# Patient Record
Sex: Male | Born: 2000 | Race: White | Hispanic: No | Marital: Single | Smoking: Former smoker
Health system: Southern US, Community
[De-identification: ages and names within clinical notes are randomized; demographics above are authoritative.]

## PROBLEM LIST (undated history)

## (undated) DIAGNOSIS — J45909 Unspecified asthma, uncomplicated: Secondary | ICD-10-CM

---
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
## 2018-02-26 ENCOUNTER — Emergency Department: Payer: Self-pay

## 2018-02-26 ENCOUNTER — Encounter: Payer: Self-pay | Admitting: Emergency Medicine

## 2018-02-26 ENCOUNTER — Emergency Department
Admission: EM | Admit: 2018-02-26 | Discharge: 2018-02-26 | Disposition: A | Discharge status: Home / Self Care | Payer: Self-pay | Attending: Emergency Medicine | Admitting: Emergency Medicine

## 2018-02-26 ENCOUNTER — Other Ambulatory Visit: Payer: Self-pay

## 2018-02-26 DIAGNOSIS — T07XXXA Unspecified multiple injuries, initial encounter: Secondary | ICD-10-CM

## 2018-02-26 DIAGNOSIS — S00511A Abrasion of lip, initial encounter: Secondary | ICD-10-CM | POA: Insufficient documentation

## 2018-02-26 DIAGNOSIS — F10929 Alcohol use, unspecified with intoxication, unspecified: Secondary | ICD-10-CM | POA: Insufficient documentation

## 2018-02-26 DIAGNOSIS — Y939 Activity, unspecified: Secondary | ICD-10-CM | POA: Insufficient documentation

## 2018-02-26 DIAGNOSIS — Y929 Unspecified place or not applicable: Secondary | ICD-10-CM | POA: Insufficient documentation

## 2018-02-26 DIAGNOSIS — S60511A Abrasion of right hand, initial encounter: Secondary | ICD-10-CM | POA: Insufficient documentation

## 2018-02-26 DIAGNOSIS — Z87891 Personal history of nicotine dependence: Secondary | ICD-10-CM | POA: Insufficient documentation

## 2018-02-26 DIAGNOSIS — Y999 Unspecified external cause status: Secondary | ICD-10-CM | POA: Insufficient documentation

## 2018-02-26 DIAGNOSIS — S60512A Abrasion of left hand, initial encounter: Secondary | ICD-10-CM | POA: Insufficient documentation

## 2018-02-26 LAB — ETHANOL: Alcohol, Ethyl (B): 304 mg/dL (ref <10)
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
## 2018-02-26 NOTE — ED Notes (Signed)
Pts wounds cleaned at this time (face and hands )

## 2019-08-22 IMAGING — CT CT HEAD W/O CM
3 series · 15 of 46 positions shown, 18 images · non-contrast
Comparison: None.

CLINICAL DATA: Head trauma with ataxia

EXAM:
CT HEAD WITHOUT CONTRAST
TECHNIQUE: Contiguous axial images were obtained from the base of the skull
through the vertex without intravenous contrast.

[Series 2: head wo · axial · 0.41mm/px · z∈[-111,+9]mm · 9 of 29 slices shown, 12 images]
[im 3/29  brain]
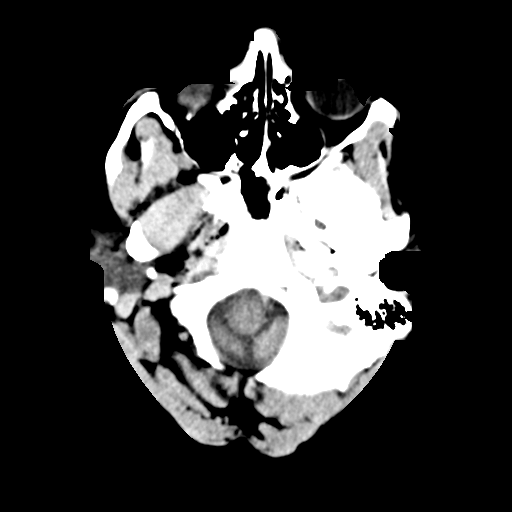
[im 3/29  bone]
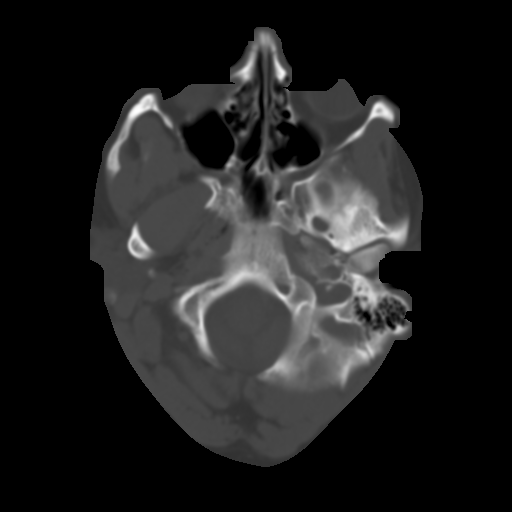
[im 6/29  brain]
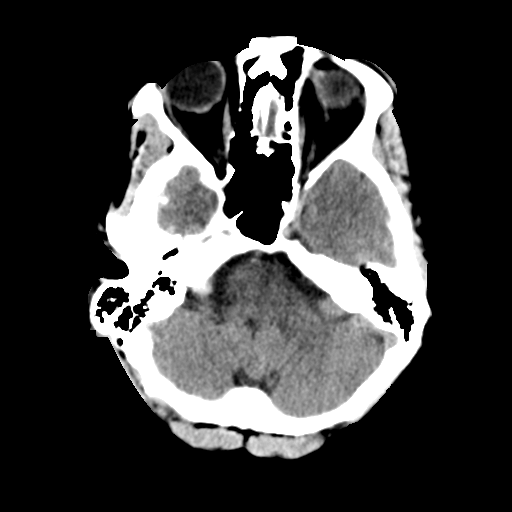
[im 9/29  brain]
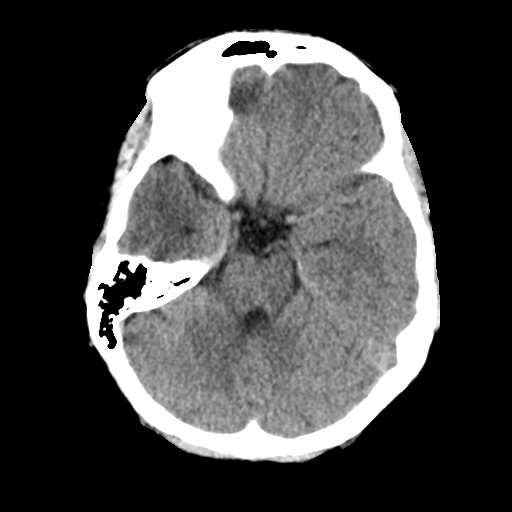
[im 12/29  brain]
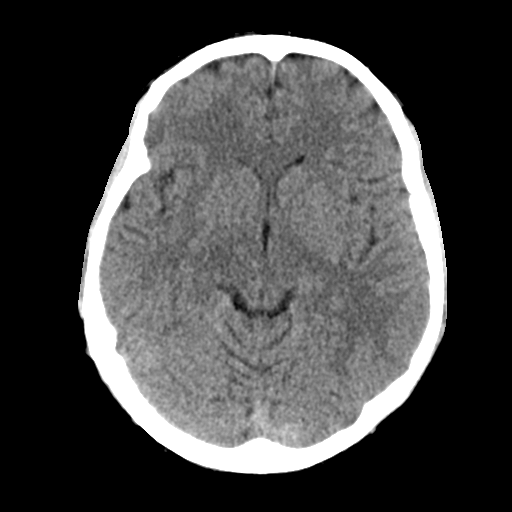
[im 15/29  brain]
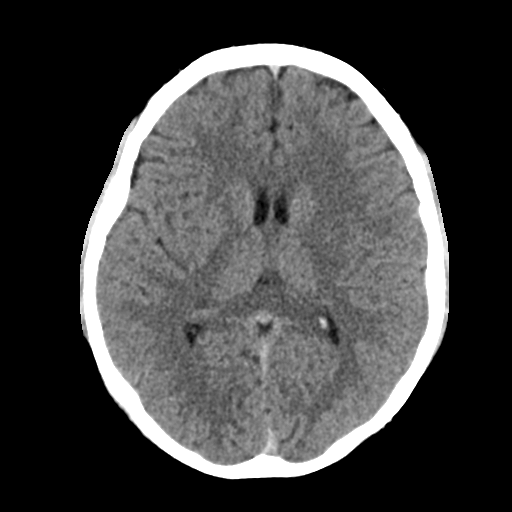
[im 15/29  bone]
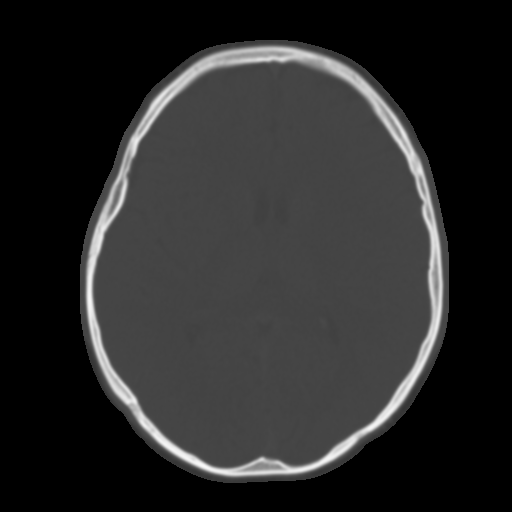
[im 18/29  brain]
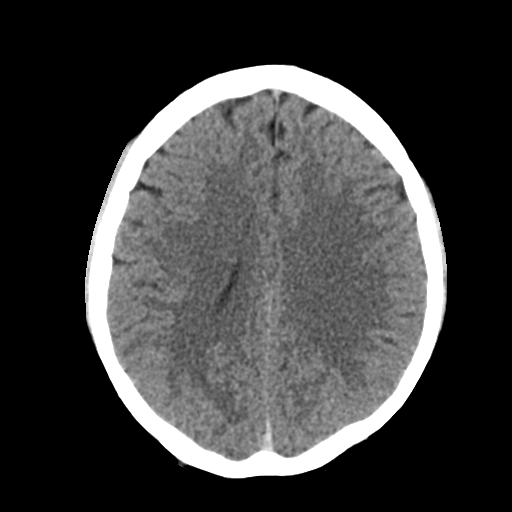
[im 21/29  brain]
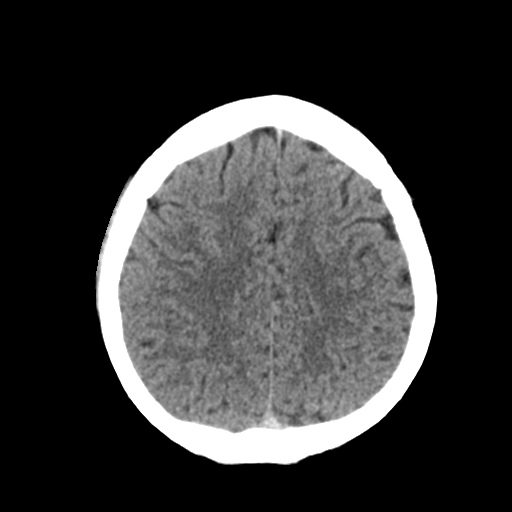
[im 24/29  brain]
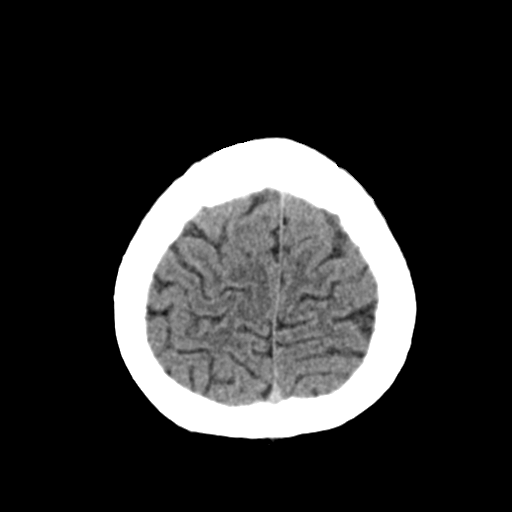
[im 27/29  brain]
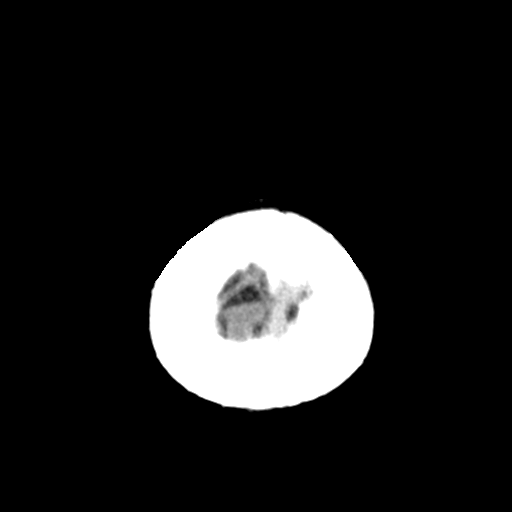
[im 27/29  bone]
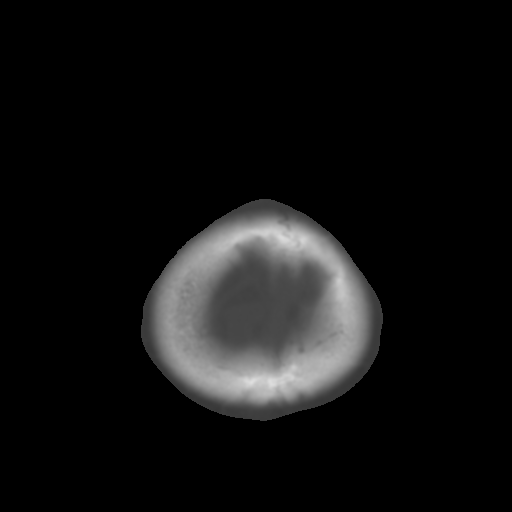

[Series 4: coronal soft tissue · coronal · 0.30mm/px · 3 of 65 slices shown]
[im 22/65  brain]
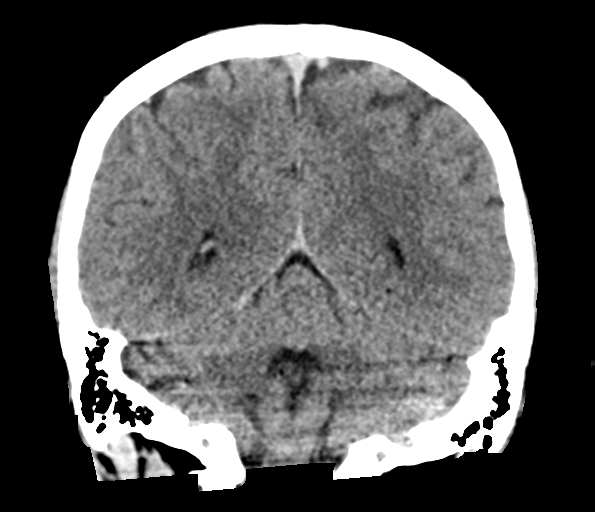
[im 29/65  brain]
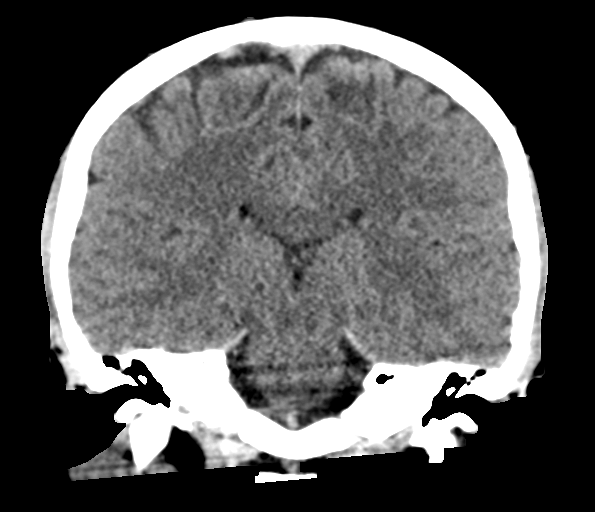
[im 36/65  brain]
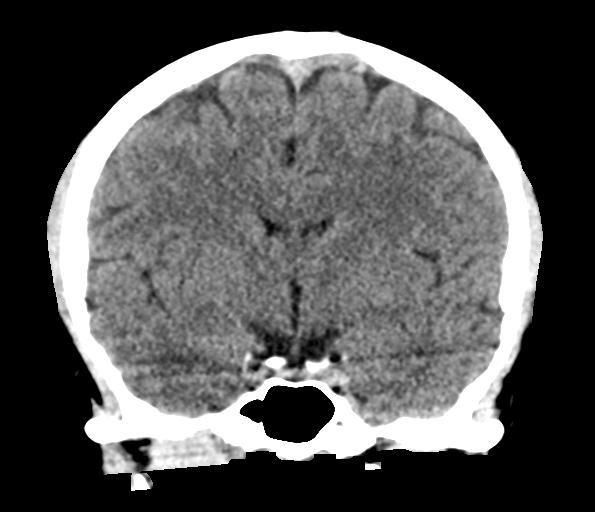

[Series 5: sagittal soft tissue · sagittal · 0.30mm/px · 3 of 61 slices shown]
[im 21/61  brain]
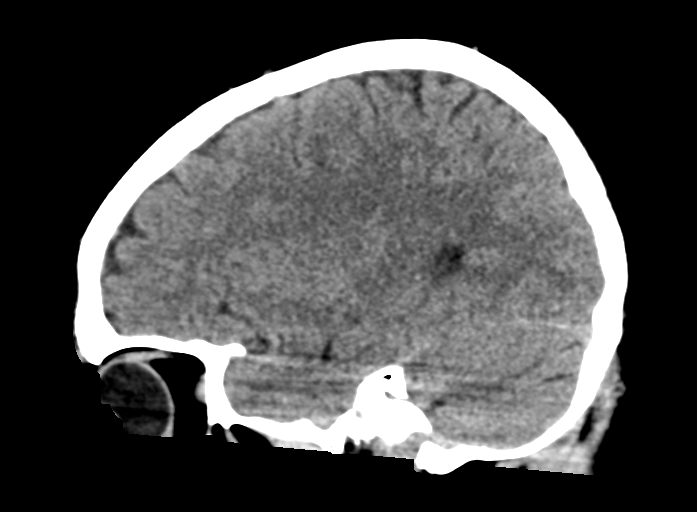
[im 31/61  brain]
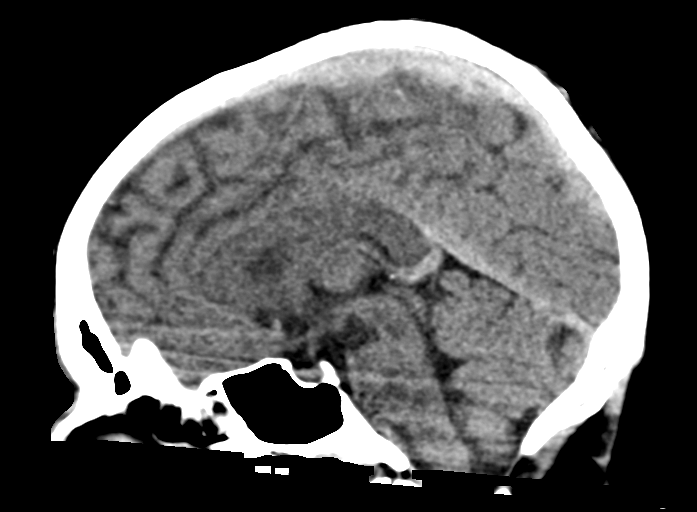
[im 41/61  brain]
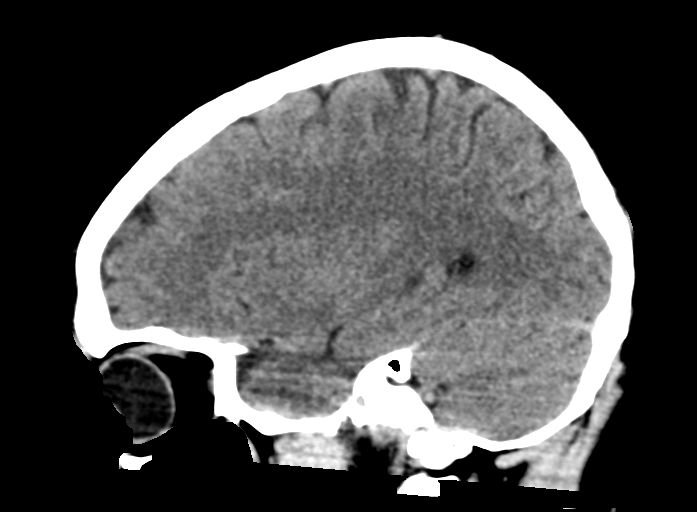

[15 of 46 positions shown; findings below may reference images not displayed]

FINDINGS: Brain: No evidence of acute infarction, hemorrhage, hydrocephalus,
extra-axial collection or mass lesion/mass effect.

Vascular: No hyperdense vessel or unexpected calcification.

Skull: Normal. Negative for fracture or focal lesion.

Sinuses/Orbits: No acute finding.

Other: None
IMPRESSION: Negative non contrasted CT appearance of the brain

## 2021-03-05 DIAGNOSIS — M542 Cervicalgia: Secondary | ICD-10-CM | POA: Insufficient documentation

## 2021-03-05 DIAGNOSIS — M545 Low back pain, unspecified: Secondary | ICD-10-CM | POA: Insufficient documentation

## 2021-07-22 DIAGNOSIS — Z029 Encounter for administrative examinations, unspecified: Secondary | ICD-10-CM | POA: Insufficient documentation

## 2021-11-30 ENCOUNTER — Other Ambulatory Visit

## 2021-11-30 ENCOUNTER — Ambulatory Visit (INDEPENDENT_AMBULATORY_CARE_PROVIDER_SITE_OTHER)

## 2021-11-30 ENCOUNTER — Ambulatory Visit
Admission: EM | Admit: 2021-11-30 | Discharge: 2021-11-30 | Disposition: A | Discharge status: Home / Self Care | Attending: Urgent Care | Admitting: Urgent Care

## 2021-11-30 DIAGNOSIS — F1010 Alcohol abuse, uncomplicated: Secondary | ICD-10-CM | POA: Insufficient documentation

## 2021-11-30 DIAGNOSIS — M795 Residual foreign body in soft tissue: Secondary | ICD-10-CM
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
## 2021-11-30 NOTE — Discharge Instructions (Addendum)
Follow up here or with your primary care provider if signs of infection develop.  Return for suture removal in 1 week.

## 2021-11-30 NOTE — ED Provider Notes (Signed)
[facility]    CSN: [id number] Arrival date & time: 11/30/21  1431      History   Chief Complaint No chief complaint on file.   HPI Juan Lindsey is a 21 y.o. male.   HPI  Patient presents to UC with report of foreign body in his left middle finger at the dorsal PIP.  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  Patient believes his body is glass which entered his skin while on active duty in the [place] in 2020.  He says the nodule was bleeding yesterday.  No past medical history on file.  There are no problems to display for this patient.   No past surgical history on file.     Home Medications    Prior to Admission medications   Not on File    Family History No family history on file.  Social History Social History   Tobacco Use   Smoking status: Former    Types: Cigarettes   Smokeless tobacco: Former    Types: Chew  Substance Use Topics   Alcohol use: Yes   Drug use: Never     Allergies   Patient has no known allergies.   Review of Systems Review of Systems   Physical Exam Triage Vital Signs ED Triage Vitals  Enc Vitals Group     BP      Pulse      Resp      Temp      Temp src      SpO2      Weight      Height      Head Circumference      Peak Flow      Pain Score      Pain Loc      Pain Edu?      Excl. in [place]?    No data found.  Updated Vital Signs There were no vitals taken for this visit.  Visual Acuity Right Eye Distance:   Left Eye Distance:   Bilateral Distance:    Right Eye Near:   Left Eye Near:    Bilateral Near:     Physical Exam Vitals reviewed.  Constitutional:      Appearance: Normal appearance.  Musculoskeletal:       Hands:  Skin:    General: Skin is warm and dry.  Neurological:     General: No focal deficit present.     Mental Status: He is alert and oriented to person, place, and time.  Psychiatric:        Mood and Affect:  Mood normal.        Behavior: Behavior normal.      UC Treatments / Results  Labs (all labs ordered are listed, but only abnormal results are displayed) Labs Reviewed - No data to display  EKG   Radiology No results found.  Procedures Foreign Body Removal  Date/Time: 11/30/2021 3:31 PM  Performed by: Rose Phi, FNP Authorized by: Rose Phi, FNP   Consent:    Consent obtained:  Verbal   Consent given by:  Patient   Risks, benefits, and alternatives were discussed: yes     Risks discussed:  Bleeding, infection and pain Universal protocol:    Procedure explained and questions answered to patient or proxy's satisfaction: yes     Relevant documents present and verified: yes     Test results available: no  Imaging studies available: no     Required blood products, implants, devices, and special equipment available: no     Site/side marked: no     Immediately prior to procedure, a time out was called: yes     Patient identity confirmed:  Verbally with patient Location:    Location:  Finger   Finger location:  L middle finger   Depth:  Intradermal   Tendon involvement:  None Pre-procedure details:    Imaging:  X-ray   Neurovascular status: intact     Preparation: Patient was prepped and draped in usual sterile fashion   Anesthesia:    Anesthesia method:  Topical application and local infiltration   Topical anesthetic:  Benzocaine gel   Local anesthetic:  Lidocaine 1% WITH epi Procedure type:    Procedure complexity:  Simple Procedure details:    Incision length:  2 mm   Localization method:  Probed   Dissection of underlying tissues: no     Bloodless field: no     Removal mechanism:  Hemostat   Foreign bodies recovered:  1   Description:  Shard of glass, triangular shaped, approx 3 mm in length   Intact foreign body removal: yes   Post-procedure details:    Neurovascular status: intact     Confirmation:  No additional foreign bodies on  visualization   Skin closure:  Sutures   Suture size:  4-0   Suture material:  Prolene   Suture technique:  Simple interrupted   Dressing:  Antibiotic ointment and adhesive bandage   Procedure completion:  Tolerated Comments:     A single 4-0 Prolene suture was placed at the incision site to provide reinforcement given the location of the suture is a bending joint.  Applied antibiotic ointment and a Band-Aid to protect the incision site while the patient works.  Patient is likely to attempt suture removal at home.  Asked him to watch for signs and symptoms of infection.  (including critical care time)  Medications Ordered in UC Medications - No data to display  Initial Impression / Assessment and Plan / UC Course  I have reviewed the triage vital signs and the nursing notes.  Pertinent labs & imaging results that were available during my care of the patient were reviewed by me and considered in my medical decision making (see chart for details).   See procedure note   Final Clinical Impressions(s) / UC Diagnoses   Final diagnoses:  Foreign body (FB) in soft tissue   Discharge Instructions   None    ED Prescriptions   None    PDMP not reviewed this encounter.   Charma Igo, [place] 11/30/21 1534

## 2022-01-03 ENCOUNTER — Ambulatory Visit
Admission: EM | Admit: 2022-01-03 | Discharge: 2022-01-03 | Disposition: A | Discharge status: Home / Self Care | Attending: Emergency Medicine | Admitting: Emergency Medicine

## 2022-01-03 DIAGNOSIS — Z1152 Encounter for screening for COVID-19: Secondary | ICD-10-CM | POA: Insufficient documentation

## 2022-01-03 DIAGNOSIS — B349 Viral infection, unspecified: Secondary | ICD-10-CM | POA: Diagnosis not present

## 2022-01-03 LAB — RESP PANEL BY RT-PCR (FLU A&B, COVID) ARPGX2
Influenza A by PCR: NEGATIVE
Influenza B by PCR: NEGATIVE
SARS Coronavirus 2 by RT PCR: NEGATIVE

## 2022-01-03 LAB — POCT RAPID STREP A (OFFICE): Rapid Strep A Screen: NEGATIVE

## 2022-01-03 NOTE — ED Triage Notes (Signed)
Patient to Urgent Care with complaints of sore throat and fatigue and body aches. Reports symptoms started this morning. Denies any known fevers. Dry cough.

## 2022-01-03 NOTE — Discharge Instructions (Signed)
Your strep test is negative.  Your COVID and Flu tests are pending.    Take Tylenol or ibuprofen as needed for fever or discomfort.  Rest and keep yourself hydrated.    Follow-up with your primary care provider if your symptoms are not improving.     

## 2022-01-03 NOTE — ED Provider Notes (Signed)
[facility]    CSN: [id number] Arrival date & time: 01/03/22  1611      History   Chief Complaint Chief Complaint  Patient presents with   Sore Throat   Fatigue    HPI Juan Lindsey is a 21 y.o. male.  Patient presents with body aches, fatigue, sore throat, cough since this morning.  No fever, rash, shortness of breath, vomiting, diarrhea, or other symptoms.  No treatments at home.  The history is provided by the patient and medical records.    History reviewed. No pertinent past medical history.  Patient Active Problem List   Diagnosis Date Noted   Alcohol abuse, uncomplicated 11/30/2021   Encounter for administrative examinations, unspecified 07/22/2021   Low back pain, unspecified 03/05/2021   Neck pain 03/05/2021    History reviewed. No pertinent surgical history.     Home Medications    Prior to Admission medications   Medication Sig Start Date End Date Taking? Authorizing Provider  cephALEXin (KEFLEX) 500 MG capsule TAKE ONE CAPSULE BY MOUTH 4 TIMES A DAY FOR 10 DAYS    [provider]  fluticasone (FLONASE) 50 MCG/ACT nasal spray Place into the nose. 01/28/21 01/28/22  [provider]  gabapentin (NEURONTIN) 300 MG capsule Take by mouth. 12/24/20   [provider]  guaiFENesin (MUCINEX) 600 MG 12 hr tablet Take by mouth. 01/28/21 01/28/22  [provider]  ibuprofen (ADVIL) 600 MG tablet Take with food/milk.Take or use exactly as directed.Obtain advice for OTCs.May cause drowsiness/dizziness.Do not take if pregnant. 03/05/21 03/05/22  [provider]  meloxicam (MOBIC) 15 MG tablet Take 1 tablet by mouth daily.    [provider]  tizanidine (ZANAFLEX) 2 MG capsule tiZANidine 2 mg oral capsule Start Date: 12/30/19 Status: Ordered 12/30/19   [provider]    Family History History reviewed. No pertinent family history.  Social History Social History   Tobacco Use   Smoking  status: Former    Types: Cigarettes   Smokeless tobacco: Former    Types: Chew  Substance Use Topics   Alcohol use: Yes   Drug use: Never     Allergies   Patient has no known allergies.   Review of Systems Review of Systems  Constitutional:  Positive for fatigue. Negative for chills and fever.  HENT:  Positive for sore throat. Negative for ear pain.   Respiratory:  Positive for cough. Negative for shortness of breath.   Cardiovascular:  Negative for chest pain and palpitations.  Gastrointestinal:  Negative for diarrhea and vomiting.  Skin:  Negative for rash.  All other systems reviewed and are negative.    Physical Exam Triage Vital Signs ED Triage Vitals  Enc Vitals Group     BP      Pulse      Resp      Temp      Temp src      SpO2      Weight      Height      Head Circumference      Peak Flow      Pain Score      Pain Loc      Pain Edu?      Excl. in [place]?    No data found.  Updated Vital Signs BP 112/82   Pulse 62   Temp 98.2 F (36.8 C)   Resp 18   Ht 5\' 8"  (1.727 m)   Wt 190 lb (86.2 kg)  SpO2 98%   BMI 28.89 kg/m   Visual Acuity Right Eye Distance:   Left Eye Distance:   Bilateral Distance:    Right Eye Near:   Left Eye Near:    Bilateral Near:     Physical Exam Vitals and nursing note reviewed.  Constitutional:      General: He is not in acute distress.    Appearance: Normal appearance. He is well-developed. He is not ill-appearing.  HENT:     Right Ear: Tympanic membrane normal.     Left Ear: Tympanic membrane normal.     Nose: Nose normal.     Mouth/Throat:     Mouth: Mucous membranes are moist.     Pharynx: Oropharynx is clear.  Cardiovascular:     Rate and Rhythm: Normal rate and regular rhythm.     Heart sounds: Normal heart sounds.  Pulmonary:     Effort: Pulmonary effort is normal. No respiratory distress.     Breath sounds: Normal breath sounds.  Musculoskeletal:     Cervical back: Neck supple.  Skin:    General:  Skin is warm and dry.  Neurological:     Mental Status: He is alert.  Psychiatric:        Mood and Affect: Mood normal.        Behavior: Behavior normal.      UC Treatments / Results  Labs (all labs ordered are listed, but only abnormal results are displayed) Labs Reviewed  RESP PANEL BY RT-PCR (FLU A&B, COVID) [id number]  POCT RAPID STREP A (OFFICE)    EKG   Radiology No results found.  Procedures Procedures (including critical care time)  Medications Ordered in UC Medications - No data to display  Initial Impression / Assessment and Plan / UC Course  I have reviewed the triage vital signs and the nursing notes.  Pertinent labs & imaging results that were available during my care of the patient were reviewed by me and considered in my medical decision making (see chart for details).    Viral illness.  Rapid strep negative.  COVID and Flu pending.  Discussed symptomatic treatment including Tylenol or ibuprofen, rest, hydration.  Instructed patient to follow up with PCP if symptoms are not improving.  He agrees to plan of care.   Final Clinical Impressions(s) / UC Diagnoses   Final diagnoses:  Viral illness     Discharge Instructions      Your strep test is negative.  Your COVID and Flu tests are pending.   Take Tylenol or ibuprofen as needed for fever or discomfort.  Rest and keep yourself hydrated.    Follow-up with your primary care provider if your symptoms are not improving.         ED Prescriptions   None    PDMP not reviewed this encounter.   Mickie Bail, NP 01/03/22 [phone]

## 2022-05-04 ENCOUNTER — Emergency Department: Payer: Self-pay

## 2022-05-04 ENCOUNTER — Other Ambulatory Visit: Payer: Self-pay

## 2022-05-04 ENCOUNTER — Emergency Department
Admission: EM | Admit: 2022-05-04 | Discharge: 2022-05-04 | Discharge status: Against Medical Advice | Payer: Self-pay | Attending: Emergency Medicine | Admitting: Emergency Medicine

## 2022-05-04 DIAGNOSIS — S60511A Abrasion of right hand, initial encounter: Secondary | ICD-10-CM | POA: Insufficient documentation

## 2022-05-04 DIAGNOSIS — Z5321 Procedure and treatment not carried out due to patient leaving prior to being seen by health care provider: Secondary | ICD-10-CM | POA: Insufficient documentation

## 2022-05-04 DIAGNOSIS — W228XXA Striking against or struck by other objects, initial encounter: Secondary | ICD-10-CM | POA: Insufficient documentation

## 2022-05-04 NOTE — ED Notes (Signed)
Pt wanting to leave, calling ride from lobby phone.   Pt encouraged to stay to be evaluated by doctor but refusing.  Pt noted to be seen leaving ED lobby with steady gait and in NAD.

## 2022-05-04 NOTE — ED Triage Notes (Signed)
Pt BIB EMS, pt reports he punched concrete with his right hand. Pt has significant swelling to right hand. Pt reports some alcohol intake today, denies any use of any recreational drugs. Pt has no bleeding at present, pt's right hand is covered in blood from a skin abrasion

## 2022-05-05 ENCOUNTER — Ambulatory Visit
Admission: RE | Admit: 2022-05-05 | Discharge: 2022-05-05 | Disposition: A | Discharge status: Home / Self Care | Payer: Self-pay | Source: Ambulatory Visit | Attending: Family Medicine | Admitting: Family Medicine

## 2022-05-05 ENCOUNTER — Other Ambulatory Visit: Payer: Self-pay | Admitting: Family Medicine

## 2022-05-05 DIAGNOSIS — M79641 Pain in right hand: Secondary | ICD-10-CM

## 2022-05-05 DIAGNOSIS — M79671 Pain in right foot: Secondary | ICD-10-CM | POA: Insufficient documentation

## 2022-08-20 ENCOUNTER — Emergency Department: Payer: Self-pay

## 2022-08-20 ENCOUNTER — Other Ambulatory Visit: Payer: Self-pay

## 2022-08-20 DIAGNOSIS — R079 Chest pain, unspecified: Secondary | ICD-10-CM | POA: Insufficient documentation

## 2022-08-20 DIAGNOSIS — Z5321 Procedure and treatment not carried out due to patient leaving prior to being seen by health care provider: Secondary | ICD-10-CM | POA: Insufficient documentation

## 2022-08-20 DIAGNOSIS — R1013 Epigastric pain: Secondary | ICD-10-CM | POA: Insufficient documentation

## 2022-08-20 DIAGNOSIS — R0602 Shortness of breath: Secondary | ICD-10-CM | POA: Insufficient documentation

## 2022-08-20 LAB — ETHANOL: Alcohol, Ethyl (B): 303 mg/dL (ref <10)

## 2022-08-20 LAB — COMPREHENSIVE METABOLIC PANEL
ALT: 46 U/L — ABNORMAL HIGH (ref 0–44)
AST: 34 U/L (ref 15–41)
Albumin: 4.7 g/dL (ref 3.5–5.0)
Alkaline Phosphatase: 56 U/L (ref 38–126)
Anion gap: 10 (ref 5–15)
BUN: 15 mg/dL (ref 6–20)
CO2: 22 mmol/L (ref 22–32)
Calcium: 8.6 mg/dL — ABNORMAL LOW (ref 8.9–10.3)
Chloride: 107 mmol/L (ref 98–111)
Creatinine, Ser: 1.13 mg/dL (ref 0.61–1.24)
GFR, Estimated: >60 mL/min (ref >60)
Glucose, Bld: 103 mg/dL — ABNORMAL HIGH (ref 70–99)
Potassium: 3.7 mmol/L (ref 3.5–5.1)
Sodium: 139 mmol/L (ref 135–145)
Total Bilirubin: 0.3 mg/dL (ref 0.3–1.2)
Total Protein: 7.5 g/dL (ref 6.5–8.1)

## 2022-08-20 LAB — CBC
HCT: 48.9 % (ref 39.0–52.0)
Hemoglobin: 17.1 g/dL — ABNORMAL HIGH (ref 13.0–17.0)
MCH: 31 pg (ref 26.0–34.0)
MCHC: 35 g/dL (ref 30.0–36.0)
MCV: 88.6 fL (ref 80.0–100.0)
Platelets: 267 10*3/uL (ref 150–400)
RBC: 5.52 MIL/uL (ref 4.22–5.81)
RDW: 11.9 % (ref 11.5–15.5)
WBC: 8.9 10*3/uL (ref 4.0–10.5)
nRBC: 0 % (ref 0.0–0.2)

## 2022-08-20 LAB — LIPASE, BLOOD: Lipase: 37 U/L (ref 11–51)

## 2022-08-20 LAB — TROPONIN I (HIGH SENSITIVITY): Troponin I (High Sensitivity): <2 ng/L (ref <18)

## 2022-08-20 NOTE — ED Triage Notes (Signed)
Pt presents to ER with c/o chest pain/epigastric pain that started around 45 minutes ago while pt was outside.  Pt states he did have appx 6 beers to drink tonight prior ro chest pain starting.  Pt endorses some associated SOB with the chest pain.  Pt denies any cardiac hx.  States pain is intermittent in nature and radiates "everywhere."  Pt otherwise A&O x4 and in NAD in triage.

## 2022-08-20 NOTE — ED Notes (Signed)
ETOH 303

## 2022-08-21 ENCOUNTER — Emergency Department
Admission: EM | Admit: 2022-08-21 | Discharge: 2022-08-21 | Discharge status: Against Medical Advice | Payer: Self-pay | Attending: Emergency Medicine | Admitting: Emergency Medicine

## 2022-08-21 HISTORY — DX: Unspecified asthma, uncomplicated: J45.909

## 2023-04-02 ENCOUNTER — Other Ambulatory Visit: Payer: Self-pay

## 2023-04-02 DIAGNOSIS — R519 Headache, unspecified: Secondary | ICD-10-CM

## 2023-04-02 DIAGNOSIS — G8929 Other chronic pain: Secondary | ICD-10-CM

## 2023-04-21 ENCOUNTER — Ambulatory Visit
Admission: RE | Admit: 2023-04-21 | Discharge: 2023-04-21 | Disposition: A | Discharge status: Home / Self Care | Payer: Self-pay | Source: Ambulatory Visit

## 2023-04-21 DIAGNOSIS — G8929 Other chronic pain: Secondary | ICD-10-CM

## 2023-04-21 MED ORDER — GADOPICLENOL 0.5 MMOL/ML IV SOLN
10.0000 mL | Freq: Once | INTRAVENOUS | Status: AC | PRN
  Administered 2023-04-21: 10 mL via INTRAVENOUS
